# Patient Record
Sex: Male | Born: 1975
Health system: Southern US, Community
[De-identification: ages and names within clinical notes are randomized; demographics above are authoritative.]

---

## 2017-09-04 ENCOUNTER — Emergency Department (HOSPITAL_BASED_OUTPATIENT_CLINIC_OR_DEPARTMENT_OTHER)
Admission: EM | Admit: 2017-09-04 | Discharge: 2017-09-04 | Disposition: A | Discharge status: Home / Self Care | Payer: PRIVATE HEALTH INSURANCE | Attending: Emergency Medicine | Admitting: Emergency Medicine

## 2017-09-04 ENCOUNTER — Encounter (HOSPITAL_BASED_OUTPATIENT_CLINIC_OR_DEPARTMENT_OTHER): Payer: Self-pay | Admitting: Emergency Medicine

## 2017-09-04 ENCOUNTER — Other Ambulatory Visit: Payer: Self-pay

## 2017-09-04 DIAGNOSIS — K0889 Other specified disorders of teeth and supporting structures: Secondary | ICD-10-CM | POA: Insufficient documentation

## 2017-09-04 DIAGNOSIS — K029 Dental caries, unspecified: Secondary | ICD-10-CM | POA: Insufficient documentation

## 2017-09-04 DIAGNOSIS — F17220 Nicotine dependence, chewing tobacco, uncomplicated: Secondary | ICD-10-CM | POA: Insufficient documentation

## 2017-09-04 MED ORDER — HYDROCODONE-ACETAMINOPHEN 5-325 MG PO TABS: 1.0000 | ORAL_TABLET | ORAL | 0 refills | Status: AC | PRN

## 2017-09-04 MED ORDER — CLINDAMYCIN HCL 300 MG PO CAPS: 300.0000 mg | ORAL_CAPSULE | Freq: Three times a day (TID) | ORAL | 0 refills | Status: AC

## 2017-09-04 MED FILL — HYDROCODON-APAP 5-325: 5-325 | 2 days supply | Qty: 10 | Fill #0

## 2017-09-04 MED FILL — CLINDAMYCIN HCL 300 MG CAPS: 300 | 10 days supply | Qty: 30 | Fill #0

## 2017-09-04 NOTE — Discharge Instructions (Signed)
Please continue Naproxen (Aleve) 500mg  twice a day. Take with food Continue Orajel as needed Take antibiotic three times daily for the next 10 days Take pain medicine as needed for severe pain. Do not drink alcohol or drive while taking this medicine Please make an appointment with a dentist Return if worsening

## 2017-09-04 NOTE — ED Triage Notes (Signed)
Patient states that he has pain to his right mouth and jaw and dental pain

## 2017-09-04 NOTE — ED Provider Notes (Signed)
[facility] EMERGENCY DEPARTMENT Provider Note   CSN: [id number] Arrival date & time: 09/04/17  1617     History   Chief Complaint Chief Complaint  Patient presents with  . Dental Pain    HPI Jacob Brennan is a 42 y.o. male who presents with dental pain. No significant PMH. He states that he's needed to get his right lower molar pulled for a while. It's been hurting the last 3-4 days. He's been taking Naproxen and using oralgel without relief. The pain radiates from his right jaw to his right ear. He denies fever, trouble swallowing, SOB. He has dental insurance but doesn't have a dentist or an appointment coming up.  HPI  History reviewed. No pertinent past medical history.  There are no active problems to display for this patient.   History reviewed. No pertinent surgical history.     Home Medications    Prior to Admission medications   Medication Sig Start Date End Date Taking? Authorizing Provider  clindamycin (CLEOCIN) 300 MG capsule Take 1 capsule (300 mg total) by mouth 3 (three) times daily. 09/04/17   Bethel Born, PA-C  HYDROcodone-acetaminophen (NORCO/VICODIN) 5-325 MG tablet Take 1 tablet by mouth every 4 (four) hours as needed. 09/04/17   Bethel Born, PA-C    Family History History reviewed. No pertinent family history.  Social History Social History   Tobacco Use  . Smoking status: Current Every Day Smoker  . Smokeless tobacco: Current User  Substance Use Topics  . Alcohol use: Yes    Frequency: Never  . Drug use: No     Allergies   Patient has no known allergies.   Review of Systems Review of Systems  Constitutional: Negative for fever.  HENT: Positive for dental problem. Negative for facial swelling and trouble swallowing.   Respiratory: Negative for shortness of breath.      Physical Exam Updated Vital Signs BP (!) 153/92 (BP Location: Left Arm)   Pulse 72   Temp 97.8 F (36.6 C) (Oral)   Resp 16   Ht  6\' 1"  (1.854 m)   Wt 104.3 kg (230 lb)   SpO2 98%   BMI 30.34 kg/m   Physical Exam  Constitutional: He is oriented to person, place, and time. He appears well-developed and well-nourished. No distress.  HENT:  Head: Normocephalic and atraumatic.  Right Ear: Hearing, tympanic membrane, external ear and ear canal normal.  Left Ear: Hearing, tympanic membrane, external ear and ear canal normal.  Mouth/Throat: Uvula is midline and oropharynx is clear and moist. No trismus in the jaw. Abnormal dentition (multiple dental fillings). Dental caries present. No dental abscesses.    Eyes: Conjunctivae are normal. Pupils are equal, round, and reactive to light. Right eye exhibits no discharge. Left eye exhibits no discharge. No scleral icterus.  Neck: Normal range of motion.  Cardiovascular: Normal rate.  Pulmonary/Chest: Effort normal. No respiratory distress.  Abdominal: He exhibits no distension.  Neurological: He is alert and oriented to person, place, and time.  Skin: Skin is warm and dry.  Psychiatric: He has a normal mood and affect. His behavior is normal.  Nursing note and vitals reviewed.    ED Treatments / Results  Labs (all labs ordered are listed, but only abnormal results are displayed) Labs Reviewed - No data to display  EKG  EKG Interpretation None       Radiology No results found.  Procedures Procedures (including critical care time)  Medications Ordered in ED Medications -  No data to display   Initial Impression / Assessment and Plan / ED Course  I have reviewed the triage vital signs and the nursing notes.  Pertinent labs & imaging results that were available during my care of the patient were reviewed by me and considered in my medical decision making (see chart for details).  Dental pain associated with dental caries and possible dental infection. Patient is afebrile, non toxic appearing, and swallowing secretions well. I gave patient referral to dentist  and stressed the importance of dental follow up for ultimate management of dental pain. He was given rx for Clindamycin and Norco. He was advised to continue OTC meds as well. I have also discussed reasons to return immediately to the ER.  Patient expresses understanding and agrees with plan.   Final Clinical Impressions(s) / ED Diagnoses   Final diagnoses:  Pain, dental    ED Discharge Orders        Ordered    clindamycin (CLEOCIN) 300 MG capsule  3 times daily     09/04/17 1646    HYDROcodone-acetaminophen (NORCO/VICODIN) 5-325 MG tablet  Every 4 hours PRN     09/04/17 1646       Bethel BornGekas, Christon Gallaway Marie, PA-C 09/04/17 1651    Cardama, Amadeo GarnetPedro Eduardo, MD 09/05/17 0045

## 2017-10-14 ENCOUNTER — Encounter (HOSPITAL_BASED_OUTPATIENT_CLINIC_OR_DEPARTMENT_OTHER): Payer: Self-pay | Admitting: Emergency Medicine

## 2017-10-14 ENCOUNTER — Emergency Department (HOSPITAL_BASED_OUTPATIENT_CLINIC_OR_DEPARTMENT_OTHER): Payer: PRIVATE HEALTH INSURANCE

## 2017-10-14 ENCOUNTER — Other Ambulatory Visit: Payer: Self-pay

## 2017-10-14 ENCOUNTER — Emergency Department (HOSPITAL_BASED_OUTPATIENT_CLINIC_OR_DEPARTMENT_OTHER)
Admission: EM | Admit: 2017-10-14 | Discharge: 2017-10-15 | Disposition: A | Discharge status: Home / Self Care | Payer: PRIVATE HEALTH INSURANCE | Attending: Emergency Medicine | Admitting: Emergency Medicine

## 2017-10-14 DIAGNOSIS — F17200 Nicotine dependence, unspecified, uncomplicated: Secondary | ICD-10-CM | POA: Insufficient documentation

## 2017-10-14 DIAGNOSIS — Z79899 Other long term (current) drug therapy: Secondary | ICD-10-CM | POA: Diagnosis not present

## 2017-10-14 DIAGNOSIS — R197 Diarrhea, unspecified: Secondary | ICD-10-CM

## 2017-10-14 DIAGNOSIS — R112 Nausea with vomiting, unspecified: Secondary | ICD-10-CM

## 2017-10-14 DIAGNOSIS — K529 Noninfective gastroenteritis and colitis, unspecified: Secondary | ICD-10-CM | POA: Diagnosis not present

## 2017-10-14 LAB — CBC
HCT: 43.3 % (ref 39.0–52.0)
HEMOGLOBIN: 15.1 g/dL (ref 13.0–17.0)
MCH: 31.3 pg (ref 26.0–34.0)
MCHC: 34.9 g/dL (ref 30.0–36.0)
MCV: 89.8 fL (ref 78.0–100.0)
Platelets: 333 10*3/uL (ref 150–400)
RBC: 4.82 MIL/uL (ref 4.22–5.81)
RDW: 13.5 % (ref 11.5–15.5)
WBC: 14.5 10*3/uL — ABNORMAL HIGH (ref 4.0–10.5)

## 2017-10-14 LAB — COMPREHENSIVE METABOLIC PANEL
ALK PHOS: 137 U/L — AB (ref 38–126)
ALT: 43 U/L (ref 17–63)
ANION GAP: 12 (ref 5–15)
AST: 39 U/L (ref 15–41)
Albumin: 4.5 g/dL (ref 3.5–5.0)
BILIRUBIN TOTAL: 0.5 mg/dL (ref 0.3–1.2)
BUN: 15 mg/dL (ref 6–20)
CALCIUM: 9 mg/dL (ref 8.9–10.3)
CO2: 23 mmol/L (ref 22–32)
Chloride: 102 mmol/L (ref 101–111)
Creatinine, Ser: 1.01 mg/dL (ref 0.61–1.24)
GFR calc non Af Amer: >60 mL/min (ref >60)
Glucose, Bld: 125 mg/dL — ABNORMAL HIGH (ref 65–99)
Potassium: 3.7 mmol/L (ref 3.5–5.1)
Sodium: 137 mmol/L (ref 135–145)
TOTAL PROTEIN: 8.1 g/dL (ref 6.5–8.1)

## 2017-10-14 LAB — URINALYSIS, ROUTINE W REFLEX MICROSCOPIC
Bilirubin Urine: NEGATIVE
GLUCOSE, UA: NEGATIVE mg/dL
Hgb urine dipstick: NEGATIVE
KETONES UR: NEGATIVE mg/dL
Leukocytes, UA: NEGATIVE
NITRITE: NEGATIVE
PH: 6 (ref 5.0–8.0)
Protein, ur: NEGATIVE mg/dL
Specific Gravity, Urine: >1.03 — ABNORMAL HIGH (ref 1.005–1.030)

## 2017-10-14 LAB — LIPASE, BLOOD: Lipase: 22 U/L (ref 11–51)

## 2017-10-14 MED ORDER — SODIUM CHLORIDE 0.9 % IV BOLUS (SEPSIS)
1000.0000 mL | Freq: Once | INTRAVENOUS | Status: AC
  Administered 2017-10-14: 1000 mL via INTRAVENOUS

## 2017-10-14 MED ORDER — CIPROFLOXACIN HCL 500 MG PO TABS: 500.0000 mg | ORAL_TABLET | Freq: Two times a day (BID) | ORAL | 0 refills | Status: AC

## 2017-10-14 MED ORDER — METRONIDAZOLE 500 MG PO TABS: 500.0000 mg | ORAL_TABLET | Freq: Three times a day (TID) | ORAL | 0 refills | Status: AC

## 2017-10-14 MED ORDER — ONDANSETRON 4 MG PO TBDP
4.0000 mg | ORAL_TABLET | Freq: Once | ORAL | Status: AC | PRN
  Administered 2017-10-14: 4 mg via ORAL
  Filled 2017-10-14: qty 1

## 2017-10-14 MED ORDER — GI COCKTAIL ~~LOC~~
30.0000 mL | Freq: Once | ORAL | Status: AC
  Administered 2017-10-14: 30 mL via ORAL
  Filled 2017-10-14: qty 30

## 2017-10-14 MED ORDER — ACETAMINOPHEN 500 MG PO TABS
1000.0000 mg | ORAL_TABLET | Freq: Once | ORAL | Status: AC
  Administered 2017-10-14: 1000 mg via ORAL
  Filled 2017-10-14: qty 2

## 2017-10-14 MED ORDER — FAMOTIDINE IN NACL 20-0.9 MG/50ML-% IV SOLN
20.0000 mg | Freq: Once | INTRAVENOUS | Status: AC
  Administered 2017-10-14: 20 mg via INTRAVENOUS
  Filled 2017-10-14: qty 50

## 2017-10-14 MED ORDER — OMEPRAZOLE 20 MG PO CPDR: 20.0000 mg | DELAYED_RELEASE_CAPSULE | Freq: Every day | ORAL | 0 refills | Status: AC

## 2017-10-14 MED ORDER — ONDANSETRON 8 MG PO TBDP: 4.0000 mg | ORAL_TABLET | Freq: Three times a day (TID) | ORAL | 0 refills | Status: AC | PRN

## 2017-10-14 MED ORDER — IOPAMIDOL (ISOVUE-300) INJECTION 61%
100.0000 mL | Freq: Once | INTRAVENOUS | Status: AC | PRN
  Administered 2017-10-14: 100 mL via INTRAVENOUS

## 2017-10-14 NOTE — Discharge Instructions (Signed)
Your CT scan did show enteritis which is infection of your intestines.  Have given you antibiotics to take.  Do not drink alcohol this medication.  Have given you Zofran for nausea.  Drink plenty of fluids stay hydrated.  Clear liquid diet for 24 hours and advance her diet as tolerated.  Follow-up with your primary care doctor return to ED with any worsening symptoms.

## 2017-10-14 NOTE — ED Notes (Signed)
ED Provider at bedside. 

## 2017-10-14 NOTE — ED Triage Notes (Signed)
Vomiting and diarrhea since 4am.

## 2017-10-16 NOTE — ED Provider Notes (Signed)
[facility] EMERGENCY DEPARTMENT Provider Note   CSN: [id number] Arrival date & time: 10/14/17  1753     History   Chief Complaint Chief Complaint  Patient presents with  . Emesis  . Diarrhea    HPI Jacob Brennan is a 42 y.o. male.  HPI 42 year old African-American male with no pertinent past medical history presents the ED with complaints of nausea, vomiting, diarrhea with generalized abdominal pain.  States the symptoms started approximately 4 AM this morning.  He is not taking for symptoms prior to arrival.  Denies any chronic NSAID use, chronic alcohol use, recent travel.  Does report eating fried chicken last night.  Patient denies any known sick contacts.  Patient denies any bloody stools.  Denies any urinary symptoms, fevers.  Pain is cramping in nature and generalized in his abdomen.  Does not radiate.  Denies any associated hematemesis.  Patient has had several episodes of loose stool along with several episodes of nonbloody emesis.  Pt denies any fever, chill, ha, vision changes, lightheadedness, dizziness, congestion, neck pain, cp, sob, cough, urinary symptoms, melena, hematochezia, lower extremity paresthesias.  History reviewed. No pertinent past medical history.  There are no active problems to display for this patient.   History reviewed. No pertinent surgical history.     Home Medications    Prior to Admission medications   Medication Sig Start Date End Date Taking? Authorizing Provider  ciprofloxacin (CIPRO) 500 MG tablet Take 1 tablet (500 mg total) by mouth every 12 (twelve) hours. 10/14/17   Rise MuLeaphart, Kenneth T, PA-C  clindamycin (CLEOCIN) 300 MG capsule Take 1 capsule (300 mg total) by mouth 3 (three) times daily. 09/04/17   Bethel BornGekas, Kelly Marie, PA-C  HYDROcodone-acetaminophen (NORCO/VICODIN) 5-325 MG tablet Take 1 tablet by mouth every 4 (four) hours as needed. 09/04/17   Bethel BornGekas, Kelly Marie, PA-C  metroNIDAZOLE (FLAGYL) 500 MG tablet Take 1  tablet (500 mg total) by mouth 3 (three) times daily. DO NOT CONSUME ALCOHOL WHILE TAKING THIS MEDICATION. 10/14/17   Rise MuLeaphart, Kenneth T, PA-C  omeprazole (PRILOSEC) 20 MG capsule Take 1 capsule (20 mg total) by mouth daily. 10/14/17   Demetrios LollLeaphart, Kenneth T, PA-C  ondansetron (ZOFRAN-ODT) 8 MG disintegrating tablet Take 0.5 tablets (4 mg total) by mouth every 8 (eight) hours as needed for nausea. 10/14/17   Rise MuLeaphart, Kenneth T, PA-C    Family History No family history on file.  Social History Social History   Tobacco Use  . Smoking status: Current Every Day Smoker  . Smokeless tobacco: Current User  Substance Use Topics  . Alcohol use: Yes    Frequency: Never  . Drug use: No     Allergies   Patient has no known allergies.   Review of Systems Review of Systems  Constitutional: Negative for chills and fever.  HENT: Negative for congestion.   Respiratory: Negative for cough.   Gastrointestinal: Positive for abdominal pain, diarrhea, nausea and vomiting. Negative for abdominal distention, blood in stool and constipation.  Genitourinary: Negative for dysuria, frequency, hematuria and urgency.  Musculoskeletal: Negative for myalgias.  Skin: Negative for color change.  Neurological: Negative for dizziness and light-headedness.     Physical Exam Updated Vital Signs BP 137/83 (BP Location: Left Arm)   Pulse 68   Temp 99 F (37.2 C) (Oral)   Resp 18   Ht 6\' 1"  (1.854 m)   Wt 105.2 kg (232 lb)   SpO2 98%   BMI 30.61 kg/m   Physical Exam  Constitutional:  He is oriented to person, place, and time. He appears well-developed and well-nourished.  Non-toxic appearance. No distress.  HENT:  Head: Normocephalic and atraumatic.  Mouth/Throat: Oropharynx is clear and moist.  Eyes: Conjunctivae are normal. Pupils are equal, round, and reactive to light. Right eye exhibits no discharge. Left eye exhibits no discharge.  Neck: Normal range of motion. Neck supple.  Cardiovascular: Normal  rate, regular rhythm, normal heart sounds and intact distal pulses. Exam reveals no gallop and no friction rub.  No murmur heard. Pulmonary/Chest: Effort normal and breath sounds normal. No stridor. No respiratory distress. He has no wheezes. He has no rales. He exhibits no tenderness.  Abdominal: Soft. He exhibits no distension. Bowel sounds are increased. There is generalized tenderness. There is no rigidity, no rebound, no guarding, no CVA tenderness, no tenderness at McBurney's point and negative Murphy's sign.  Musculoskeletal: Normal range of motion. He exhibits no tenderness.  Lymphadenopathy:    He has no cervical adenopathy.  Neurological: He is alert and oriented to person, place, and time.  Skin: Skin is warm and dry. Capillary refill takes less than 2 seconds. No rash noted.  Psychiatric: His behavior is normal. Judgment and thought content normal.  Nursing note and vitals reviewed.    ED Treatments / Results  Labs (all labs ordered are listed, but only abnormal results are displayed) Labs Reviewed  URINALYSIS, ROUTINE W REFLEX MICROSCOPIC - Abnormal; Notable for the following components:      Result Value   Specific Gravity, Urine >1.030 (*)    All other components within normal limits  COMPREHENSIVE METABOLIC PANEL - Abnormal; Notable for the following components:   Glucose, Bld 125 (*)    Alkaline Phosphatase 137 (*)    All other components within normal limits  CBC - Abnormal; Notable for the following components:   WBC 14.5 (*)    All other components within normal limits  LIPASE, BLOOD    EKG  EKG Interpretation None       Radiology Ct Abdomen Pelvis W Contrast  Result Date: 10/14/2017 CLINICAL DATA:  Nausea, vomiting and diarrhea beginning at 0400 hours. EXAM: CT ABDOMEN AND PELVIS WITH CONTRAST TECHNIQUE: Multidetector CT imaging of the abdomen and pelvis was performed using the standard protocol following bolus administration of intravenous contrast.  CONTRAST:  ISOVUE-300 IOPAMIDOL (ISOVUE-300) INJECTION 61% COMPARISON:  None. FINDINGS: LOWER CHEST: Lung bases are clear. Included heart size is normal. No pericardial effusion. HEPATOBILIARY: Liver and gallbladder are normal. PANCREAS: Normal. SPLEEN: Normal. ADRENALS/URINARY TRACT: Kidneys are orthotopic, demonstrating symmetric enhancement. No nephrolithiasis, hydronephrosis or solid renal masses. The unopacified ureters are normal in course and caliber. Urinary bladder is partially distended and unremarkable. Normal adrenal glands. STOMACH/BOWEL: There small hiatal hernia. The stomach, small and large bowel are normal in caliber without inflammatory changes. Fluid-filled small bowel. Proximal small bowel predominately in RIGHT upper quadrant, ligament of Treitz not well characterized without enteric contrast. Normal appendix. VASCULAR/LYMPHATIC: Aortoiliac vessels are normal in course and caliber. No lymphadenopathy by CT size criteria. REPRODUCTIVE: Normal. OTHER: No intraperitoneal free fluid or free air. Tiny fat containing umbilical hernia. MUSCULOSKELETAL: Nonacute. IMPRESSION: 1. Fluid-filled small bowel associated with enteritis. Normal appendix. Electronically Signed   By: Awilda Metro M.D.   On: 10/14/2017 22:34    Procedures Procedures (including critical care time)  Medications Ordered in ED Medications  ondansetron (ZOFRAN-ODT) disintegrating tablet 4 mg (4 mg Oral Given 10/14/17 1812)  sodium chloride 0.9 % bolus 1,000 mL (0 mLs Intravenous  Stopped 10/14/17 2325)  famotidine (PEPCID) IVPB 20 mg premix (0 mg Intravenous Stopped 10/14/17 2207)  gi cocktail (Maalox,Lidocaine,Donnatal) (30 mLs Oral Given 10/14/17 2131)  iopamidol (ISOVUE-300) 61 % injection 100 mL (100 mLs Intravenous Contrast Given 10/14/17 2158)  acetaminophen (TYLENOL) tablet 1,000 mg (1,000 mg Oral Given 10/14/17 2215)     Initial Impression / Assessment and Plan / ED Course  I have reviewed the triage vital  signs and the nursing notes.  Pertinent labs & imaging results that were available during my care of the patient were reviewed by me and considered in my medical decision making (see chart for details).     Patient with symptoms consistent with enteritis.  Patient with leukocytosis of 14,000.  Normal lipase.  Liver enzymes unremarkable.  CT scan was obtained given patient's significant tenderness along with elevated white blood cell count.  Does note enteritis but no other focal findings.  Vitals are stable, no fever.  No signs of dehydration, tolerating PO fluids > 6 oz.  Lungs are clear.  Repeat abdominal exam is benign without any signs of peritonitis.  Patient will be started on Cipro and Flagyl for enteritis.  Supportive therapy indicated with return if symptoms worsen.    Pt is hemodynamically stable, in NAD, & able to ambulate in the ED. Evaluation does not show pathology that would require ongoing emergent intervention or inpatient treatment. I explained the diagnosis to the patient. Pain has been managed & has no complaints prior to dc. Pt is comfortable with above plan and is stable for discharge at this time. All questions were answered prior to disposition. Strict return precautions for f/u to the ED were discussed. Encouraged follow up with PCP.  Pt dicussed with my attending who is agreeable with the above plan.   Final Clinical Impressions(s) / ED Diagnoses   Final diagnoses:  Nausea vomiting and diarrhea  Enteritis    ED Discharge Orders        Ordered    metroNIDAZOLE (FLAGYL) 500 MG tablet  3 times daily     10/14/17 2311    ciprofloxacin (CIPRO) 500 MG tablet  Every 12 hours     10/14/17 2311    ondansetron (ZOFRAN-ODT) 8 MG disintegrating tablet  Every 8 hours PRN     10/14/17 2312    omeprazole (PRILOSEC) 20 MG capsule  Daily     10/14/17 2312       Rise Mu, PA-C 10/16/17 1615    Tegeler, Canary Brim, MD 10/16/17 2055

## 2018-04-02 ENCOUNTER — Other Ambulatory Visit: Payer: Self-pay

## 2018-04-02 ENCOUNTER — Emergency Department (HOSPITAL_BASED_OUTPATIENT_CLINIC_OR_DEPARTMENT_OTHER)
Admission: EM | Admit: 2018-04-02 | Discharge: 2018-04-02 | Disposition: A | Discharge status: Home / Self Care | Payer: PRIVATE HEALTH INSURANCE | Attending: Emergency Medicine | Admitting: Emergency Medicine

## 2018-04-02 ENCOUNTER — Encounter (HOSPITAL_BASED_OUTPATIENT_CLINIC_OR_DEPARTMENT_OTHER): Payer: Self-pay | Admitting: Emergency Medicine

## 2018-04-02 DIAGNOSIS — Y999 Unspecified external cause status: Secondary | ICD-10-CM | POA: Diagnosis not present

## 2018-04-02 DIAGNOSIS — F17200 Nicotine dependence, unspecified, uncomplicated: Secondary | ICD-10-CM | POA: Insufficient documentation

## 2018-04-02 DIAGNOSIS — Y93H2 Activity, gardening and landscaping: Secondary | ICD-10-CM | POA: Diagnosis not present

## 2018-04-02 DIAGNOSIS — Y92017 Garden or yard in single-family (private) house as the place of occurrence of the external cause: Secondary | ICD-10-CM | POA: Insufficient documentation

## 2018-04-02 DIAGNOSIS — Z23 Encounter for immunization: Secondary | ICD-10-CM | POA: Insufficient documentation

## 2018-04-02 DIAGNOSIS — Z79899 Other long term (current) drug therapy: Secondary | ICD-10-CM | POA: Insufficient documentation

## 2018-04-02 DIAGNOSIS — S81811A Laceration without foreign body, right lower leg, initial encounter: Secondary | ICD-10-CM | POA: Insufficient documentation

## 2018-04-02 DIAGNOSIS — W268XXA Contact with other sharp object(s), not elsewhere classified, initial encounter: Secondary | ICD-10-CM | POA: Diagnosis not present

## 2018-04-02 MED ORDER — IBUPROFEN 600 MG PO TABS: 600.0000 mg | ORAL_TABLET | Freq: Four times a day (QID) | ORAL | 0 refills | Status: AC | PRN

## 2018-04-02 MED ORDER — LIDOCAINE-EPINEPHRINE (PF) 2 %-1:200000 IJ SOLN
10.0000 mL | Freq: Once | INTRAMUSCULAR | Status: AC
  Administered 2018-04-02: 10 mL
  Filled 2018-04-02 (×2): qty 10

## 2018-04-02 MED ORDER — TETANUS-DIPHTH-ACELL PERTUSSIS 5-2.5-18.5 LF-MCG/0.5 IM SUSP
0.5000 mL | Freq: Once | INTRAMUSCULAR | Status: AC
  Administered 2018-04-02: 0.5 mL via INTRAMUSCULAR
  Filled 2018-04-02: qty 0.5

## 2018-04-02 NOTE — ED Notes (Signed)
Pt given wound care instructions. Bacitracin, and kerlex applied to stitches/lacs

## 2018-04-02 NOTE — Discharge Instructions (Signed)
Please read and follow all provided instructions.  Your diagnoses today is a laceration. A laceration is a cut or lesion that goes through all layers of the skin and into the tissue just beneath the skin. This was repaired with stitches or a tissue adhesive similar to a super glue.  Follow up with your doctor, an urgent care, or this Emergency Department for removal of your stitches in  7-10 days. Keep the wound clean and dry for the next 24 hours and leave the dressing in place. You may shower after 24 hours. Do not soak the area for long periods of times as in a bath until the sutures are removed. After 24 hours you may remove the dressing and gently clean the laceration site with antibacterial soap (i.e. Neosporin or Bacitracin) and warm water 2 times a day. Pat dry with clean towel. Do not scrub. Once the wound has healed, scarring can be minimized by covering the wound with sunscreen during the day for 1 full year.  Return instructions:  You have redness, swelling, or increasing pain in the wound.  You see a red line that goes away from the wound.  You have yellowish-white fluid (pus) coming from the wound.  You have a fever (above 100.33F) You notice a bad smell coming from the wound or dressing.  Your wound breaks open before or after sutures have been removed.  You notice something coming out of the wound such as wood or glass.  Your wound is on your hand or foot and you cannot move a finger or toe.  Your pain is not controlled with prescribed medicine.     Additional Information:  If you did not receive a tetanus shot today because you thought you were up to date, but did not recall when your last one was given, make sure to check with your primary caregiver to determine if you need one.   Your vital signs today were: BP (!) 143/82 (BP Location: Left Arm)    Pulse 74    Temp 98 F (36.7 C) (Oral)    Resp 20    Ht 6\' 1"  (1.854 m)    Wt 99.8 kg    SpO2 98%    BMI 29.03 kg/m  If your  blood pressure (BP) was elevated above 135/85 this visit, please have this repeated by your doctor within one month.

## 2018-04-02 NOTE — ED Provider Notes (Signed)
[facility] EMERGENCY DEPARTMENT Provider Note   CSN: [id number] Arrival date & time: 04/02/18  1648     History   Chief Complaint Chief Complaint  Patient presents with  . Laceration    HPI Jacob Brennan is a 42 y.o. male with no significant past medical history presents emergency department today for right lower leg laceration.  Reports that he was riding his lawnmower when his leg scraped against something that was sticking out from side of the house.  He notes 2 small lacerations to his right lower leg.  He reports he has wrapped the area himself.  Bleeding is currently controlled.  He denies any difficulty with range of motion of the ankle, or distal numbness/tingling/weakness.  He is unsure his last tetanus shot.  HPI  History reviewed. No pertinent past medical history.  There are no active problems to display for this patient.   History reviewed. No pertinent surgical history.      Home Medications    Prior to Admission medications   Medication Sig Start Date End Date Taking? Authorizing Provider  ciprofloxacin (CIPRO) 500 MG tablet Take 1 tablet (500 mg total) by mouth every 12 (twelve) hours. 10/14/17   Rise MuLeaphart, Kenneth T, PA-C  clindamycin (CLEOCIN) 300 MG capsule Take 1 capsule (300 mg total) by mouth 3 (three) times daily. 09/04/17   Bethel BornGekas, Kelly Marie, PA-C  HYDROcodone-acetaminophen (NORCO/VICODIN) 5-325 MG tablet Take 1 tablet by mouth every 4 (four) hours as needed. 09/04/17   Bethel BornGekas, Kelly Marie, PA-C  metroNIDAZOLE (FLAGYL) 500 MG tablet Take 1 tablet (500 mg total) by mouth 3 (three) times daily. DO NOT CONSUME ALCOHOL WHILE TAKING THIS MEDICATION. 10/14/17   Rise MuLeaphart, Kenneth T, PA-C  omeprazole (PRILOSEC) 20 MG capsule Take 1 capsule (20 mg total) by mouth daily. 10/14/17   Demetrios LollLeaphart, Kenneth T, PA-C  ondansetron (ZOFRAN-ODT) 8 MG disintegrating tablet Take 0.5 tablets (4 mg total) by mouth every 8 (eight) hours as needed for nausea. 10/14/17    Rise MuLeaphart, Kenneth T, PA-C    Family History History reviewed. No pertinent family history.  Social History Social History   Tobacco Use  . Smoking status: Current Every Day Smoker  . Smokeless tobacco: Current User  Substance Use Topics  . Alcohol use: Yes    Frequency: Never  . Drug use: No     Allergies   Patient has no known allergies.   Review of Systems Review of Systems  Constitutional: Negative for fever.  Musculoskeletal: Negative for arthralgias.  Skin: Positive for wound.  Neurological: Negative for weakness and numbness.  All other systems reviewed and are negative.    Physical Exam Updated Vital Signs BP (!) 143/82 (BP Location: Left Arm)   Pulse 74   Temp 98 F (36.7 C) (Oral)   Resp 20   Ht 6\' 1"  (1.854 m)   Wt 99.8 kg   SpO2 98%   BMI 29.03 kg/m   Physical Exam  Constitutional: He appears well-developed and well-nourished.  HENT:  Head: Normocephalic and atraumatic.  Right Ear: External ear normal.  Left Ear: External ear normal.  Eyes: Conjunctivae are normal. Right eye exhibits no discharge. Left eye exhibits no discharge. No scleral icterus.  Cardiovascular:  Pulses:      Dorsalis pedis pulses are 2+ on the right side.       Posterior tibial pulses are 2+ on the right side.  Pulmonary/Chest: Effort normal. No respiratory distress.  Musculoskeletal:       Right ankle:  Normal. Achilles tendon normal.       Legs:      Right foot: Normal.  Neurological: He is alert. He has normal strength. No sensory deficit.  Skin: Skin is warm and dry. Capillary refill takes less than 2 seconds. Laceration noted. No pallor.  Psychiatric: He has a normal mood and affect.  Nursing note and vitals reviewed.    ED Treatments / Results  Labs (all labs ordered are listed, but only abnormal results are displayed) Labs Reviewed - No data to display  EKG None  Radiology No results found.  Procedures .Marland Kitchen.Laceration Repair Date/Time: 04/02/2018 6:34  PM Performed by: Jacinto HalimMaczis, Nilda Keathley M, PA-C Authorized by: Jacinto HalimMaczis, Honor Frison M, PA-C   Consent:    Consent obtained:  Verbal   Consent given by:  Patient   Risks discussed:  Infection, nerve damage, need for additional repair, pain, poor cosmetic result, poor wound healing, vascular damage, tendon damage and retained foreign body   Alternatives discussed:  No treatment Anesthesia (see MAR for exact dosages):    Anesthesia method:  Local infiltration   Local anesthetic:  Lidocaine 2% WITH epi Laceration details:    Location:  Leg   Leg location:  R lower leg   Length (cm):  2 Repair type:    Repair type:  Simple Pre-procedure details:    Preparation:  Patient was prepped and draped in usual sterile fashion Exploration:    Hemostasis achieved with:  Direct pressure   Wound exploration: wound explored through full range of motion and entire depth of wound probed and visualized     Contaminated: no   Treatment:    Area cleansed with:  Shur-Clens and saline   Amount of cleaning:  Standard   Irrigation solution:  Sterile saline   Irrigation volume:  100   Irrigation method:  Syringe   Visualized foreign bodies/material removed: no   Skin repair:    Repair method:  Sutures   Suture size:  4-0   Wound skin closure material used: vicryl.   Suture technique:  Simple interrupted   Number of sutures:  6 Approximation:    Approximation:  Close Post-procedure details:    Dressing:  Open (no dressing)   Patient tolerance of procedure:  Tolerated well, no immediate complications  .Marland Kitchen.Laceration Repair Date/Time: 04/02/2018 6:44 PM Performed by: Jacinto HalimMaczis, Jorgen Wolfinger M, PA-C Authorized by: Jacinto HalimMaczis, Iesha Summerhill M, PA-C   Consent:    Consent obtained:  Verbal   Consent given by:  Patient   Risks discussed:  Infection, need for additional repair, nerve damage, poor wound healing, poor cosmetic result, pain, retained foreign body, tendon damage and vascular damage   Alternatives discussed:  No  treatment Anesthesia (see MAR for exact dosages):    Anesthesia method:  None Laceration details:    Location:  Leg   Leg location:  R lower leg   Length (cm):  1 Repair type:    Repair type:  Simple Pre-procedure details:    Preparation:  Patient was prepped and draped in usual sterile fashion Exploration:    Hemostasis achieved with:  Direct pressure   Wound exploration: wound explored through full range of motion and entire depth of wound probed and visualized     Contaminated: no   Treatment:    Area cleansed with:  Shur-Clens and saline   Amount of cleaning:  Standard   Irrigation solution:  Sterile saline   Irrigation volume:  100   Irrigation method:  Syringe   Visualized foreign bodies/material  removed: no   Skin repair:    Repair method:  Sutures   Suture size:  5-0   Wound skin closure material used: Vicryl.   Suture technique:  Simple interrupted   Number of sutures:  3 Approximation:    Approximation:  Close Post-procedure details:    Dressing:  Open (no dressing)   Patient tolerance of procedure:  Tolerated well, no immediate complications   (including critical care time)  Medications Ordered in ED Medications  lidocaine-EPINEPHrine (XYLOCAINE W/EPI) 2 %-1:200000 (PF) injection 10 mL (10 mLs Infiltration Given 04/02/18 1727)  Tdap (BOOSTRIX) injection 0.5 mL (0.5 mLs Intramuscular Given 04/02/18 1727)     Initial Impression / Assessment and Plan / ED Course  I have reviewed the triage vital signs and the nursing notes.  Pertinent labs & imaging results that were available during my care of the patient were reviewed by me and considered in my medical decision making (see chart for details).     42 y.o. male with lacerations to the right lower leg. He is NVI. No evidence of tendon injury. Pressure irrigation performed. Wound explored and base of wound visualized in a bloodless field without evidence of foreign body.  Laceration occurred < 8 hours prior to  repair which was well tolerated.  Tdap updated.  Pt has  no comorbidities to effect normal wound healing. Pt discharged  without antibiotics.  Discussed suture home care with patient and answered questions. Pt to follow-up for wound check and suture removal in 7 days; they are to return to the ED sooner for signs of infection. Pt is hemodynamically stable with no complaints prior to dc.   Final Clinical Impressions(s) / ED Diagnoses   Final diagnoses:  Laceration of right lower leg, initial encounter    ED Discharge Orders         Ordered    ibuprofen (ADVIL,MOTRIN) 600 MG tablet  Every 6 hours PRN     04/02/18 1846           Princella Pellegrini 04/02/18 2018    Melene Plan, DO 04/02/18 2241

## 2018-04-02 NOTE — ED Notes (Signed)
Wound unwrapped from the patients wrapping and rewrapped -wound is still oozing blood

## 2018-04-02 NOTE — ED Triage Notes (Signed)
Patient states that he was on the riding lawnmower and cut his right lower leg on a piece of metal.

## 2018-10-03 ENCOUNTER — Emergency Department (HOSPITAL_BASED_OUTPATIENT_CLINIC_OR_DEPARTMENT_OTHER)
Admission: EM | Admit: 2018-10-03 | Discharge: 2018-10-03 | Disposition: A | Discharge status: Home / Self Care | Payer: PRIVATE HEALTH INSURANCE | Attending: Emergency Medicine | Admitting: Emergency Medicine

## 2018-10-03 ENCOUNTER — Encounter (HOSPITAL_BASED_OUTPATIENT_CLINIC_OR_DEPARTMENT_OTHER): Payer: Self-pay | Admitting: Emergency Medicine

## 2018-10-03 ENCOUNTER — Emergency Department (HOSPITAL_BASED_OUTPATIENT_CLINIC_OR_DEPARTMENT_OTHER): Payer: PRIVATE HEALTH INSURANCE

## 2018-10-03 ENCOUNTER — Other Ambulatory Visit: Payer: Self-pay

## 2018-10-03 DIAGNOSIS — H6693 Otitis media, unspecified, bilateral: Secondary | ICD-10-CM | POA: Diagnosis not present

## 2018-10-03 DIAGNOSIS — F17200 Nicotine dependence, unspecified, uncomplicated: Secondary | ICD-10-CM | POA: Insufficient documentation

## 2018-10-03 DIAGNOSIS — Z79899 Other long term (current) drug therapy: Secondary | ICD-10-CM | POA: Insufficient documentation

## 2018-10-03 DIAGNOSIS — R69 Illness, unspecified: Secondary | ICD-10-CM

## 2018-10-03 DIAGNOSIS — M791 Myalgia, unspecified site: Secondary | ICD-10-CM | POA: Diagnosis present

## 2018-10-03 DIAGNOSIS — J111 Influenza due to unidentified influenza virus with other respiratory manifestations: Secondary | ICD-10-CM | POA: Insufficient documentation

## 2018-10-03 MED ORDER — ACETAMINOPHEN 325 MG PO TABS
650.0000 mg | ORAL_TABLET | Freq: Once | ORAL | Status: AC
  Administered 2018-10-03: 650 mg via ORAL

## 2018-10-03 MED ORDER — AMOXICILLIN 500 MG PO CAPS
500.0000 mg | ORAL_CAPSULE | Freq: Three times a day (TID) | ORAL | 0 refills | Status: AC
Start: 2018-10-03 — End: ?

## 2018-10-03 MED ORDER — ACETAMINOPHEN 325 MG PO TABS
ORAL_TABLET | ORAL | Status: AC
  Filled 2018-10-03: qty 2

## 2018-10-03 MED ORDER — OSELTAMIVIR PHOSPHATE 75 MG PO CAPS: 75.0000 mg | ORAL_CAPSULE | Freq: Two times a day (BID) | ORAL | 0 refills | Status: AC

## 2018-10-03 MED FILL — OSELTAMIVIR PHOSPHATE 75 MG: 75 | 5 days supply | Qty: 10 | Fill #0

## 2018-10-03 MED FILL — AMOXICILLIN 500 MG CAPSULE: 500 | 7 days supply | Qty: 21 | Fill #0

## 2018-10-03 NOTE — Discharge Instructions (Addendum)
You were seen in the ER for flu like symptoms Prescribed medicines:  - Tamiflu- treatment for flu - Amoxicillin- antibiotics for ear infection- wait 48 hours to see if symptoms improve prior to starting this medicine.   We have prescribed you new medication(s) today. Discuss the medications prescribed today with your pharmacist as they can have adverse effects and interactions with your other medicines including over the counter and prescribed medications. Seek medical evaluation if you start to experience new or abnormal symptoms after taking one of these medicines, seek care immediately if you start to experience difficulty breathing, feeling of your throat closing, facial swelling, or rash as these could be indications of a more serious allergic reaction  Follow up with primary care within 1 week. Return to the ER for new or worsening symptoms or any other concerns.

## 2018-10-03 NOTE — ED Provider Notes (Signed)
[facility] EMERGENCY DEPARTMENT Provider Note   CSN: [id number] Arrival date & time: 10/03/18  1553   History   Chief Complaint Chief Complaint  Patient presents with  . Generalized Body Aches    HPI Jacob Brennan is a 43 y.o. male with a hx of tobacco abuse who presents to the ED with complaints of flu like sxs x 2 days. Patient reports congestion, bilateral ear pain, sore throat, productive cough with yellow phlegm sputum, chills, subjective fevers, & generalized body aches. He states that with coughing he feels a bit short of breath at times, otherwise none. No alleviating/aggravating factors. Denies sick contacts w/ similar. Did not receive flu shot this year.   HPI  History reviewed. No pertinent past medical history.  There are no active problems to display for this patient.   History reviewed. No pertinent surgical history.      Home Medications    Prior to Admission medications   Medication Sig Start Date End Date Taking? Authorizing Provider  ciprofloxacin (CIPRO) 500 MG tablet Take 1 tablet (500 mg total) by mouth every 12 (twelve) hours. 10/14/17   Rise Mu, PA-C  clindamycin (CLEOCIN) 300 MG capsule Take 1 capsule (300 mg total) by mouth 3 (three) times daily. 09/04/17   Bethel Born, PA-C  HYDROcodone-acetaminophen (NORCO/VICODIN) 5-325 MG tablet Take 1 tablet by mouth every 4 (four) hours as needed. 09/04/17   Bethel Born, PA-C  ibuprofen (ADVIL,MOTRIN) 600 MG tablet Take 1 tablet (600 mg total) by mouth every 6 (six) hours as needed. 04/02/18   Maczis, Elmer Sow, PA-C  metroNIDAZOLE (FLAGYL) 500 MG tablet Take 1 tablet (500 mg total) by mouth 3 (three) times daily. DO NOT CONSUME ALCOHOL WHILE TAKING THIS MEDICATION. 10/14/17   Rise Mu, PA-C  omeprazole (PRILOSEC) 20 MG capsule Take 1 capsule (20 mg total) by mouth daily. 10/14/17   Demetrios Loll T, PA-C  ondansetron (ZOFRAN-ODT) 8 MG disintegrating tablet Take 0.5  tablets (4 mg total) by mouth every 8 (eight) hours as needed for nausea. 10/14/17   Rise Mu, PA-C    Family History History reviewed. No pertinent family history.  Social History Social History   Tobacco Use  . Smoking status: Current Every Day Smoker  . Smokeless tobacco: Current User  Substance Use Topics  . Alcohol use: Yes    Frequency: Never  . Drug use: Yes    Types: Marijuana     Allergies   Patient has no known allergies.   Review of Systems Review of Systems  Constitutional: Positive for chills and fever (subjective).  HENT: Positive for congestion, ear pain and sore throat.   Respiratory: Positive for cough and shortness of breath (w/ coughing). Negative for wheezing.   Cardiovascular: Negative for chest pain.  Gastrointestinal: Negative for abdominal pain and vomiting.  Musculoskeletal: Positive for myalgias (generalized).  Neurological: Negative for syncope.     Physical Exam Updated Vital Signs BP 103/65 (BP Location: Left Arm)   Pulse 97   Temp 97.9 F (36.6 C) (Oral)   Resp 16   Ht 6\' 1"  (1.854 m)   Wt 102.1 kg   SpO2 97%   BMI 29.69 kg/m   Physical Exam Vitals signs and nursing note reviewed.  Constitutional:      General: He is not in acute distress.    Appearance: He is well-developed. He is not toxic-appearing.  HENT:     Head: Normocephalic and atraumatic.     Right  Ear: Ear canal and external ear normal. Tympanic membrane is erythematous. Tympanic membrane is not perforated.     Left Ear: Ear canal and external ear normal. Tympanic membrane is erythematous. Tympanic membrane is not perforated.     Ears:     Comments: Purulence behind bilateral TMs. No complete loss of landmarks.  No mastoid erythema/warmth/swelling/tenderness.     Nose: Congestion present.     Right Sinus: No maxillary sinus tenderness or frontal sinus tenderness.     Left Sinus: No maxillary sinus tenderness or frontal sinus tenderness.      Mouth/Throat:     Pharynx: Uvula midline. Posterior oropharyngeal erythema present. No oropharyngeal exudate.     Comments: Posterior oropharynx is symmetric appearing. Patient tolerating own secretions without difficulty. No trismus. No drooling. No hot potato voice. No swelling beneath the tongue, submandibular compartment is soft.  Eyes:     General:        Right eye: No discharge.        Left eye: No discharge.     Extraocular Movements: Extraocular movements intact.     Conjunctiva/sclera: Conjunctivae normal.  Neck:     Musculoskeletal: Neck supple. No edema, neck rigidity or crepitus.  Cardiovascular:     Rate and Rhythm: Normal rate and regular rhythm.  Pulmonary:     Effort: Pulmonary effort is normal. No respiratory distress.     Breath sounds: Normal breath sounds. No wheezing, rhonchi or rales.  Abdominal:     General: There is no distension.     Palpations: Abdomen is soft.     Tenderness: There is no abdominal tenderness.  Skin:    General: Skin is warm and dry.     Findings: No rash.  Neurological:     Mental Status: He is alert.     Comments: Clear speech.   Psychiatric:        Behavior: Behavior normal.      ED Treatments / Results  Labs (all labs ordered are listed, but only abnormal results are displayed) Labs Reviewed - No data to display  EKG None  Radiology Dg Chest 2 View  Result Date: 10/03/2018 CLINICAL DATA:  Cough EXAM: CHEST - 2 VIEW COMPARISON:  None. FINDINGS: Normal heart size. Normal mediastinal contour. No pneumothorax. No pleural effusion. Lungs appear clear, with no acute consolidative airspace disease and no pulmonary edema. IMPRESSION: No active cardiopulmonary disease. Electronically Signed   By: Delbert Phenix M.D.   On: 10/03/2018 17:13    Procedures Procedures (including critical care time)  Medications Ordered in ED Medications - No data to display   Initial Impression / Assessment and Plan / ED Course  I have reviewed the  triage vital signs and the nursing notes.  Pertinent labs & imaging results that were available during my care of the patient were reviewed by me and considered in my medical decision making (see chart for details).   Patient presents to the ED with flu like sxs x 2 days. Nontoxic appearing, in no apparent distress, vitals WNL. Lungs CTA, CXR negative- doubt pneumonia. No sinus tenderness, < 7 days since onset of sxs- doubt acute bacterial sinusitis. Centor 0- doubt strep. No meningismus. Exam somewhat concerning for bilateral early AOM w/ erythema & purulence of bilateral TMs. No evidence of AOE or mastoiditis. Will provide abx given appearance w/ bilateral sxs- watch & wait for 48 hours. Also possible influenza, no comorbidities or age requirement to necessitate tamiflu- offered this medicine & discussed risks/benefits- patient  would like to receive this medicine. I discussed results, treatment plan, need for follow-up, and return precautions with the patient. Provided opportunity for questions, patient confirmed understanding and is in agreement with plan.    Final Clinical Impressions(s) / ED Diagnoses   Final diagnoses:  Influenza-like illness  Bilateral otitis media, unspecified otitis media type    ED Discharge Orders         Ordered    amoxicillin (AMOXIL) 500 MG capsule  3 times daily     10/03/18 1730    oseltamivir (TAMIFLU) 75 MG capsule  Every 12 hours     10/03/18 [id number]           Rishaan Gunner, HumansvilleSamantha R, PA-C 10/03/18 1734    Maia PlanLong, Joshua G, MD 10/04/18 1043

## 2018-10-03 NOTE — ED Triage Notes (Signed)
Reports generalized body aches, fever, chills, congestion which began yesterday.

## 2018-10-22 ENCOUNTER — Encounter (HOSPITAL_BASED_OUTPATIENT_CLINIC_OR_DEPARTMENT_OTHER): Payer: Self-pay | Admitting: Emergency Medicine

## 2018-10-22 ENCOUNTER — Emergency Department (HOSPITAL_BASED_OUTPATIENT_CLINIC_OR_DEPARTMENT_OTHER): Payer: PRIVATE HEALTH INSURANCE

## 2018-10-22 ENCOUNTER — Other Ambulatory Visit: Payer: Self-pay

## 2018-10-22 ENCOUNTER — Emergency Department (HOSPITAL_BASED_OUTPATIENT_CLINIC_OR_DEPARTMENT_OTHER)
Admission: EM | Admit: 2018-10-22 | Discharge: 2018-10-22 | Disposition: A | Discharge status: Home / Self Care | Payer: PRIVATE HEALTH INSURANCE | Attending: Emergency Medicine | Admitting: Emergency Medicine

## 2018-10-22 DIAGNOSIS — R112 Nausea with vomiting, unspecified: Secondary | ICD-10-CM | POA: Insufficient documentation

## 2018-10-22 DIAGNOSIS — B349 Viral infection, unspecified: Secondary | ICD-10-CM | POA: Diagnosis not present

## 2018-10-22 DIAGNOSIS — R1010 Upper abdominal pain, unspecified: Secondary | ICD-10-CM | POA: Diagnosis present

## 2018-10-22 DIAGNOSIS — R1011 Right upper quadrant pain: Secondary | ICD-10-CM | POA: Diagnosis not present

## 2018-10-22 DIAGNOSIS — Z79899 Other long term (current) drug therapy: Secondary | ICD-10-CM | POA: Diagnosis not present

## 2018-10-22 DIAGNOSIS — F17210 Nicotine dependence, cigarettes, uncomplicated: Secondary | ICD-10-CM | POA: Diagnosis not present

## 2018-10-22 DIAGNOSIS — R509 Fever, unspecified: Secondary | ICD-10-CM

## 2018-10-22 LAB — CBC WITH DIFFERENTIAL/PLATELET
ABS IMMATURE GRANULOCYTES: 0.05 10*3/uL (ref 0.00–0.07)
BASOS ABS: 0 10*3/uL (ref 0.0–0.1)
BASOS PCT: 0 %
EOS ABS: 0.1 10*3/uL (ref 0.0–0.5)
Eosinophils Relative: 0 %
HCT: 40.2 % (ref 39.0–52.0)
Hemoglobin: 13.3 g/dL (ref 13.0–17.0)
IMMATURE GRANULOCYTES: 0 %
Lymphocytes Relative: 8 %
Lymphs Abs: 1.1 10*3/uL (ref 0.7–4.0)
MCH: 30.9 pg (ref 26.0–34.0)
MCHC: 33.1 g/dL (ref 30.0–36.0)
MCV: 93.3 fL (ref 80.0–100.0)
MONOS PCT: 6 %
Monocytes Absolute: 0.8 10*3/uL (ref 0.1–1.0)
NEUTROS PCT: 86 %
NRBC: 0 % (ref 0.0–0.2)
Neutro Abs: 11.8 10*3/uL — ABNORMAL HIGH (ref 1.7–7.7)
PLATELETS: 411 10*3/uL — AB (ref 150–400)
RBC: 4.31 MIL/uL (ref 4.22–5.81)
RDW: 13.1 % (ref 11.5–15.5)
WBC: 13.8 10*3/uL — ABNORMAL HIGH (ref 4.0–10.5)

## 2018-10-22 LAB — COMPREHENSIVE METABOLIC PANEL
ALK PHOS: 114 U/L (ref 38–126)
ALT: 32 U/L (ref 0–44)
AST: 25 U/L (ref 15–41)
Albumin: 4 g/dL (ref 3.5–5.0)
Anion gap: 5 (ref 5–15)
BUN: 12 mg/dL (ref 6–20)
CO2: 27 mmol/L (ref 22–32)
Calcium: 8.4 mg/dL — ABNORMAL LOW (ref 8.9–10.3)
Chloride: 108 mmol/L (ref 98–111)
Creatinine, Ser: 0.95 mg/dL (ref 0.61–1.24)
GFR calc Af Amer: >60 mL/min (ref >60)
GFR calc non Af Amer: >60 mL/min (ref >60)
GLUCOSE: 113 mg/dL — AB (ref 70–99)
Potassium: 3.8 mmol/L (ref 3.5–5.1)
Sodium: 140 mmol/L (ref 135–145)
Total Bilirubin: 0.7 mg/dL (ref 0.3–1.2)
Total Protein: 6.9 g/dL (ref 6.5–8.1)

## 2018-10-22 LAB — URINALYSIS, ROUTINE W REFLEX MICROSCOPIC
Bilirubin Urine: NEGATIVE
Glucose, UA: NEGATIVE mg/dL
Hgb urine dipstick: NEGATIVE
Ketones, ur: NEGATIVE mg/dL
Leukocytes,Ua: NEGATIVE
Nitrite: NEGATIVE
PH: 7 (ref 5.0–8.0)
Protein, ur: NEGATIVE mg/dL
Specific Gravity, Urine: 1.01 (ref 1.005–1.030)

## 2018-10-22 LAB — LIPASE, BLOOD: Lipase: 27 U/L (ref 11–51)

## 2018-10-22 MED ORDER — SODIUM CHLORIDE 0.9 % IV BOLUS
1000.0000 mL | Freq: Once | INTRAVENOUS | Status: AC
  Administered 2018-10-22: 1000 mL via INTRAVENOUS

## 2018-10-22 MED ORDER — ONDANSETRON HCL 4 MG/2ML IJ SOLN
4.0000 mg | Freq: Once | INTRAMUSCULAR | Status: AC
  Administered 2018-10-22: 4 mg via INTRAVENOUS
  Filled 2018-10-22: qty 2

## 2018-10-22 MED ORDER — ACETAMINOPHEN 325 MG PO TABS
650.0000 mg | ORAL_TABLET | Freq: Once | ORAL | Status: AC
  Administered 2018-10-22: 650 mg via ORAL
  Filled 2018-10-22: qty 2

## 2018-10-22 MED ORDER — ONDANSETRON HCL 4 MG PO TABS: 4.0000 mg | ORAL_TABLET | Freq: Three times a day (TID) | ORAL | 0 refills | Status: AC | PRN

## 2018-10-22 MED FILL — ONDANSETRON HCL 4 MG TABLET: 4 | 4 days supply | Qty: 12 | Fill #0

## 2018-10-22 NOTE — ED Notes (Signed)
Patient called out for water.  States he is dehydrated.  Explained to patient that vomiting needed to be controlled prior to giving anything by mouth.  Explained to patient that vitals were stable and provider would be in to see him shortly.

## 2018-10-22 NOTE — ED Notes (Signed)
Patient is resting comfortably. 

## 2018-10-22 NOTE — ED Triage Notes (Addendum)
C/o vomiting which began this morning.  Denies diarrhea, fevers.  Ambulatory to room in NAD.

## 2018-10-22 NOTE — ED Notes (Signed)
PO ORDERED BY DOCTOR PT. DOING WELL

## 2018-10-22 NOTE — Discharge Instructions (Signed)
Your work-up today was overall reassuring.  I suspect you have a viral infection causing your symptoms.  Your labs were reassuring.  Please use the nausea medicine to help with your symptoms and maintain hydration.  Please rest.  Please to out of work today and tomorrow.  If any symptoms change or worsen, please return to the nearest emergency department.  Please follow-up with your PCP.

## 2018-10-22 NOTE — ED Provider Notes (Signed)
[facility] EMERGENCY DEPARTMENT Provider Note   CSN: [id number] Arrival date & time: 10/22/18  9562    History   Chief Complaint Chief Complaint  Patient presents with  . Emesis    HPI Jacob Brennan is a 43 y.o. male.     The history is provided by the patient and medical records. No language interpreter was used.  Emesis  Severity:  Severe Duration:  1 day Timing:  Intermittent Quality:  Stomach contents Progression:  Unchanged Chronicity:  New Recent urination:  Normal Relieved by:  Nothing Ineffective treatments:  None tried Associated symptoms: chills and myalgias   Associated symptoms: no abdominal pain, no cough, no diarrhea, no fever, no headaches, no sore throat and no URI (recent)     History reviewed. No pertinent past medical history.  There are no active problems to display for this patient.   History reviewed. No pertinent surgical history.      Home Medications    Prior to Admission medications   Medication Sig Start Date End Date Taking? Authorizing Provider  amoxicillin (AMOXIL) 500 MG capsule Take 1 capsule (500 mg total) by mouth 3 (three) times daily. 10/03/18   Petrucelli, Samantha R, PA-C  ciprofloxacin (CIPRO) 500 MG tablet Take 1 tablet (500 mg total) by mouth every 12 (twelve) hours. 10/14/17   Rise Mu, PA-C  clindamycin (CLEOCIN) 300 MG capsule Take 1 capsule (300 mg total) by mouth 3 (three) times daily. 09/04/17   Bethel Born, PA-C  HYDROcodone-acetaminophen (NORCO/VICODIN) 5-325 MG tablet Take 1 tablet by mouth every 4 (four) hours as needed. 09/04/17   Bethel Born, PA-C  ibuprofen (ADVIL,MOTRIN) 600 MG tablet Take 1 tablet (600 mg total) by mouth every 6 (six) hours as needed. 04/02/18   Maczis, Elmer Sow, PA-C  metroNIDAZOLE (FLAGYL) 500 MG tablet Take 1 tablet (500 mg total) by mouth 3 (three) times daily. DO NOT CONSUME ALCOHOL WHILE TAKING THIS MEDICATION. 10/14/17   Rise Mu, PA-C    omeprazole (PRILOSEC) 20 MG capsule Take 1 capsule (20 mg total) by mouth daily. 10/14/17   Demetrios Loll T, PA-C  ondansetron (ZOFRAN-ODT) 8 MG disintegrating tablet Take 0.5 tablets (4 mg total) by mouth every 8 (eight) hours as needed for nausea. 10/14/17   Rise Mu, PA-C  oseltamivir (TAMIFLU) 75 MG capsule Take 1 capsule (75 mg total) by mouth every 12 (twelve) hours. 10/03/18   Petrucelli, Pleas Koch, PA-C    Family History History reviewed. No pertinent family history.  Social History Social History   Tobacco Use  . Smoking status: Current Every Day Smoker  . Smokeless tobacco: Current User  Substance Use Topics  . Alcohol use: Yes    Frequency: Never  . Drug use: Yes    Types: Marijuana     Allergies   Patient has no known allergies.   Review of Systems Review of Systems  Constitutional: Positive for chills and fatigue. Negative for diaphoresis and fever.  HENT: Negative for congestion and sore throat.   Eyes: Negative for visual disturbance.  Respiratory: Negative for cough, choking, chest tightness, shortness of breath, wheezing and stridor.   Cardiovascular: Negative for chest pain and leg swelling.  Gastrointestinal: Positive for nausea and vomiting. Negative for abdominal pain, constipation and diarrhea.  Genitourinary: Negative for dysuria.  Musculoskeletal: Positive for myalgias. Negative for back pain, neck pain and neck stiffness.  Skin: Negative for rash and wound.  Neurological: Negative for light-headedness, numbness and headaches.  Psychiatric/Behavioral:  Negative for agitation.  All other systems reviewed and are negative.    Physical Exam Updated Vital Signs BP 133/85 (BP Location: Right Arm)   Pulse 97   Temp 98.2 F (36.8 C) (Oral)   Resp 18   Ht 6\' 1"  (1.854 m)   Wt 97.5 kg   SpO2 99%   BMI 28.37 kg/m   Physical Exam Vitals signs and nursing note reviewed.  Constitutional:      General: He is not in acute distress.     Appearance: He is well-developed. He is not ill-appearing, toxic-appearing or diaphoretic.  HENT:     Head: Normocephalic and atraumatic.     Right Ear: Tympanic membrane normal.     Left Ear: Tympanic membrane normal.     Nose: Nose normal. No congestion or rhinorrhea.     Mouth/Throat:     Mouth: Mucous membranes are moist.     Pharynx: No oropharyngeal exudate or posterior oropharyngeal erythema.  Eyes:     Conjunctiva/sclera: Conjunctivae normal.     Pupils: Pupils are equal, round, and reactive to light.  Neck:     Musculoskeletal: Neck supple. No muscular tenderness.  Cardiovascular:     Rate and Rhythm: Normal rate and regular rhythm.     Heart sounds: No murmur.  Pulmonary:     Effort: Pulmonary effort is normal. No respiratory distress.     Breath sounds: Normal breath sounds.  Abdominal:     Palpations: Abdomen is soft.     Tenderness: There is no abdominal tenderness. There is no right CVA tenderness, left CVA tenderness, guarding or rebound.  Musculoskeletal:        General: No tenderness.     Right lower leg: No edema.     Left lower leg: No edema.  Skin:    General: Skin is warm and dry.     Capillary Refill: Capillary refill takes less than 2 seconds.  Neurological:     General: No focal deficit present.     Mental Status: He is alert.      ED Treatments / Results  Labs (all labs ordered are listed, but only abnormal results are displayed) Labs Reviewed  CBC WITH DIFFERENTIAL/PLATELET - Abnormal; Notable for the following components:      Result Value   WBC 13.8 (*)    Platelets 411 (*)    Neutro Abs 11.8 (*)    All other components within normal limits  COMPREHENSIVE METABOLIC PANEL - Abnormal; Notable for the following components:   Glucose, Bld 113 (*)    Calcium 8.4 (*)    All other components within normal limits  URINE CULTURE  LIPASE, BLOOD  URINALYSIS, ROUTINE W REFLEX MICROSCOPIC    EKG None  Radiology Dg Chest 2 View  Result Date:  10/22/2018 CLINICAL DATA:  43 year old male with vomiting since this morning. Diagnosed with influenza last week. Upper abdominal pain and cough. EXAM: CHEST - 2 VIEW COMPARISON:  10/03/2018 chest radiographs. FINDINGS: Lower lung volumes. Mediastinal contours remain normal. Visualized tracheal air column is within normal limits. No pneumothorax, pulmonary edema, pleural effusion or confluent pulmonary opacity. No acute osseous abnormality identified. Negative visible bowel gas pattern. IMPRESSION: Lower lung volumes, otherwise no acute cardiopulmonary abnormality. Electronically Signed   By: Odessa Fleming M.D.   On: 10/22/2018 13:57   US Abdomen Limited Ruq  Result Date: 10/22/2018 CLINICAL DATA:  43 year old male with upper abdominal pain and vomiting since this morning. Diagnosed with influenza last week. EXAM: ULTRASOUND  ABDOMEN LIMITED RIGHT UPPER QUADRANT COMPARISON:  CT Abdomen and Pelvis 10/14/2017 FINDINGS: Gallbladder: No gallstones or sludge identified. Probable tiny 2-3 millimeter gallbladder polyp on image 11 (inconsequential). No gallbladder wall thickening. No sonographic Murphy sign noted by sonographer. Common bile duct: Diameter: 4-5 millimeters, normal. Liver: No focal lesion identified. Within normal limits in parenchymal echogenicity. Portal vein is patent on color Doppler imaging with normal direction of blood flow towards the liver. Other findings: Right kidney within normal limits; somewhat isoechoic to the liver. IMPRESSION: Negative. A tiny 2-3 mm gallbladder polyp appears inconsequential. Electronically Signed   By: Odessa Fleming M.D.   On: 10/22/2018 13:59    Procedures Procedures (including critical care time)  Medications Ordered in ED Medications  sodium chloride 0.9 % bolus 1,000 mL (0 mLs Intravenous Stopped 10/22/18 1232)  ondansetron (ZOFRAN) injection 4 mg (4 mg Intravenous Given 10/22/18 1007)  sodium chloride 0.9 % bolus 1,000 mL ( Intravenous Stopped 10/22/18 1524)  acetaminophen  (TYLENOL) tablet 650 mg (650 mg Oral Given 10/22/18 1526)     Initial Impression / Assessment and Plan / ED Course  I have reviewed the triage vital signs and the nursing notes.  Pertinent labs & imaging results that were available during my care of the patient were reviewed by me and considered in my medical decision making (see chart for details).        Laton Glasper is a 43 y.o. male with no significant past medical history who presents with fevers, chills, nausea, vomiting, and malaise.  Patient reports that last week he had influenza and had more URI symptoms such as congestion cough fevers, chills.  He reports that he was doing better until today when he said chills again as well as nausea and vomiting.  He denies significant abdominal pain constipation or diarrhea.  He reports that very dehydrated.  Denies recent trauma.  Denies other medication changes.  Denies any headache, neck pain or neck stiffness.  Does report malaise.  On exam, abdomen is nontender.  Lungs are clear and chest is nontender.  Patient has no focal neurologic deficits and has normal sensation and strength in extremities.  Normal pulses in extremities.  Vital signs reassuring on arrival.  Clinical aspect patient has a viral illness however due to his feeling very dehydrated and dry mucous membranes on exam, he will be given fluids.  He has had numerous episodes of vomiting here in the emergency department.  Will give IV Zofran.  Based on his reassuring vital signs, will hold on labs at this time and with his lack of cough and reassuring lung exam while on chest x-ray.  If patient is able to eat and drink after Zofran and rehydration, suspect he is stable for discharge home with work note.  Suspect viral infection.  Anticipate reassessment.  1:13 PM Patient was reassessed and reports other than nausea improving he is feeling much worse overall.  He reports his abdominal pain is worsened in the upper abdomen.  He  reports he is having pain "across my diaphragm" and some coughing.   Due to his worsening symptoms, he will be placed on telemetry, given more fluids, will have some labs as well as a right upper quadrant ultrasound and chest x-ray and urinalysis.  Continue suspect viral infection however given worsening symptoms, will make sure there are no other abnormalities causing his symptoms.  Patient is feeling much better after further fluids.  Labs are reassuring.  Mild leukocytosis likely indicative of his viral  infection.  Chest x-ray shows no pneumonia.  Her upper quadrant ultrasound showed no cholecystitis.  Other labs reassuring.  Patient likely viral infection causing nausea vomiting and abdominal symptoms.    Patient will follow with PCP and given prescription for nausea medication and work note.  Patient agreed with plan of care and instructions to maintain hydration.  He knows the questions or concerns and patient was discharged in good condition.   Final Clinical Impressions(s) / ED Diagnoses   Final diagnoses:  Upper abdominal pain  Right upper quadrant abdominal pain  Non-intractable vomiting with nausea, unspecified vomiting type  Fever, unspecified fever cause  Viral infection    ED Discharge Orders         Ordered    ondansetron (ZOFRAN) 4 MG tablet  Every 8 hours PRN     10/22/18 1531          Clinical Impression: 1. Right upper quadrant abdominal pain   2. Upper abdominal pain   3. Non-intractable vomiting with nausea, unspecified vomiting type   4. Fever, unspecified fever cause   5. Viral infection     Disposition: Discharge  Condition: Good  I have discussed the results, Dx and Tx plan with the pt(& family if present). He/she/they expressed understanding and agree(s) with the plan. Discharge instructions discussed at great length. Strict return precautions discussed and pt &/or family have verbalized understanding of the instructions. No further questions at  time of discharge.    New Prescriptions   ONDANSETRON (ZOFRAN) 4 MG TABLET    Take 1 tablet (4 mg total) by mouth every 8 (eight) hours as needed.    Follow Up: [facility] [address] [place] [address] [phone] Schedule an appointment as soon as possible for a visit    [facility]GENCY DEPARTMENT [address] [id number] [place] [address] [place] [address] [phone]       Dre Gamino, Canary Brim, MD 10/22/18 1537

## 2018-10-24 LAB — URINE CULTURE: Culture: NO GROWTH

## 2020-10-05 IMAGING — US US ABDOMEN LIMITED
1 series · 14 of 25 positions shown · non-contrast
Comparison: CT Abdomen and Pelvis 10/14/2017

CLINICAL DATA: 42-year-old male with upper abdominal pain and
vomiting since this morning. Diagnosed with influenza last week.

EXAM:
ULTRASOUND ABDOMEN LIMITED RIGHT UPPER QUADRANT

[Series 1: us abdomen limited · 0.12mm/px · 14 of 59 slices shown]
[im 1/59]
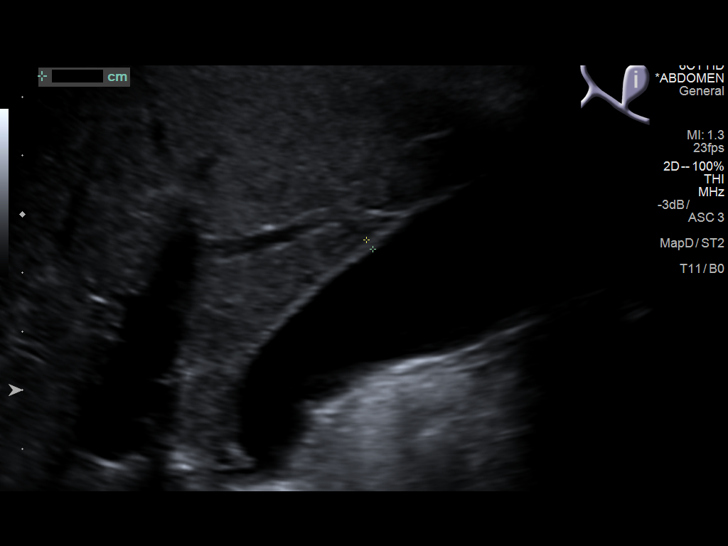
[im 5/59]
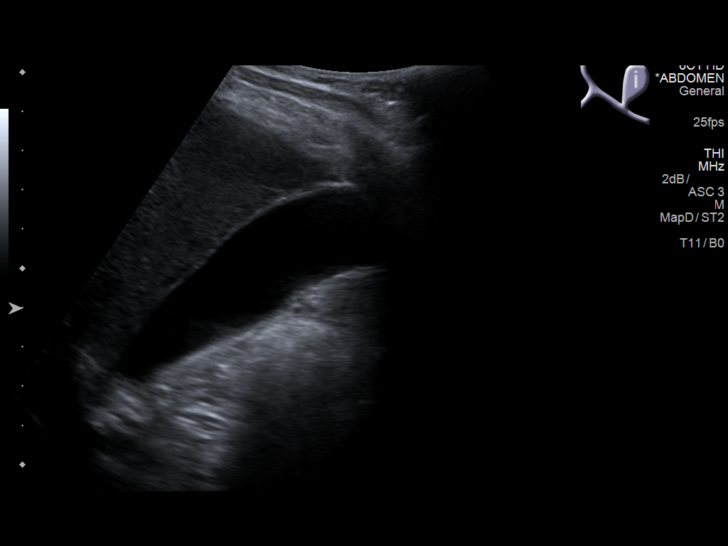
[im 10/59]
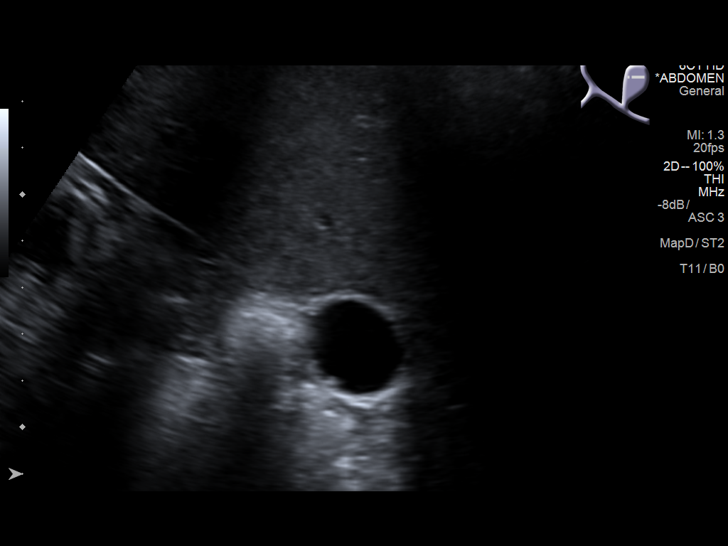
[im 15/59]
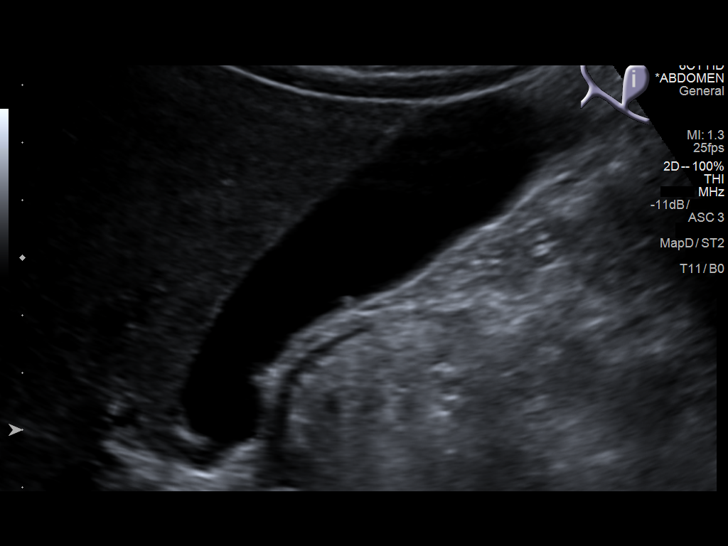
[im 20/59]
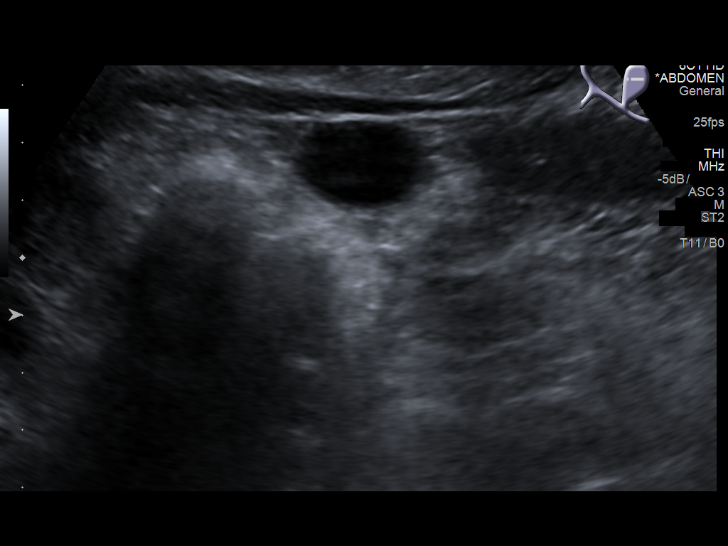
[im 22/59]
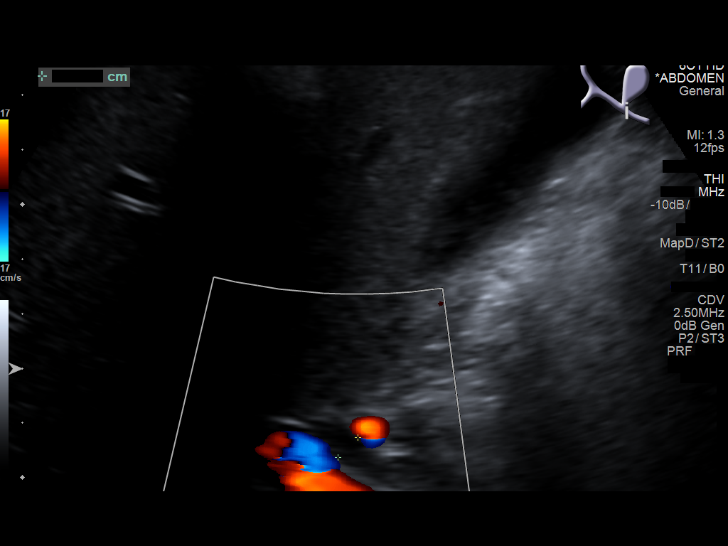
[im 27/59]
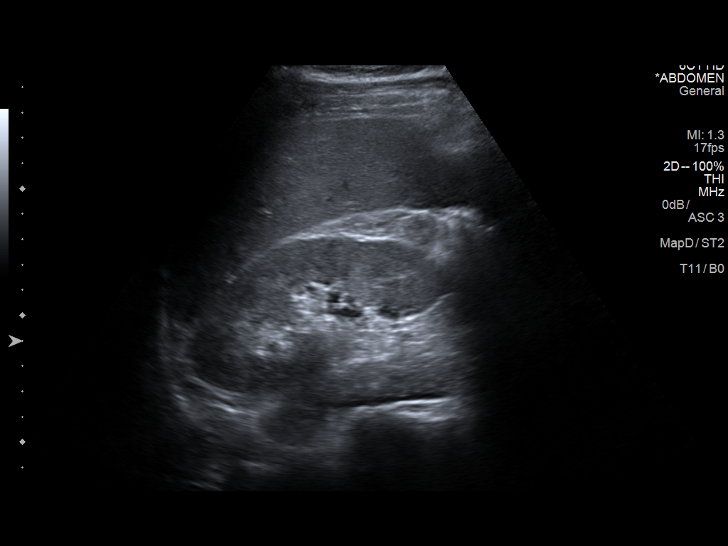
[im 32/59]
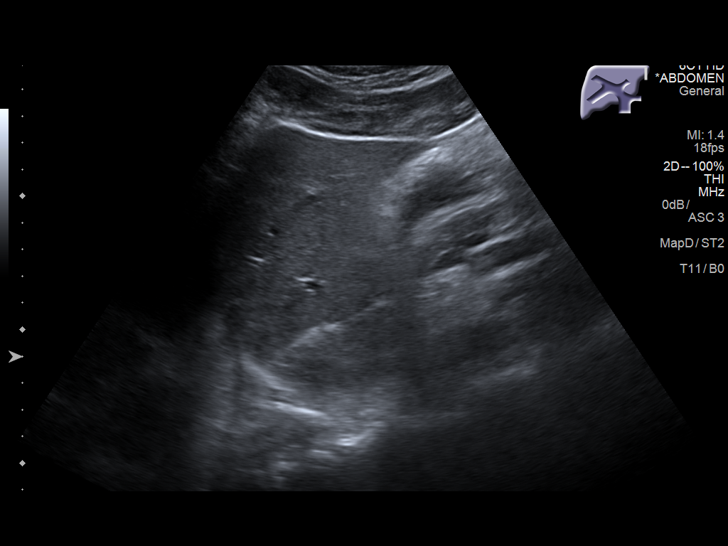
[im 37/59]
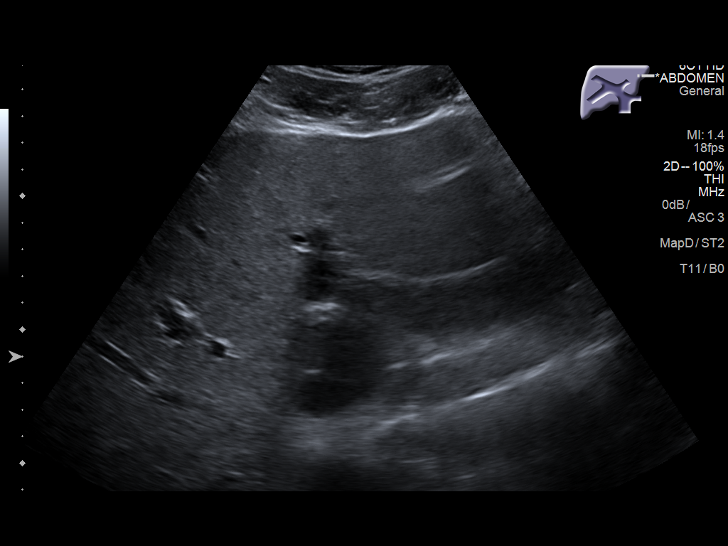
[im 39/59]
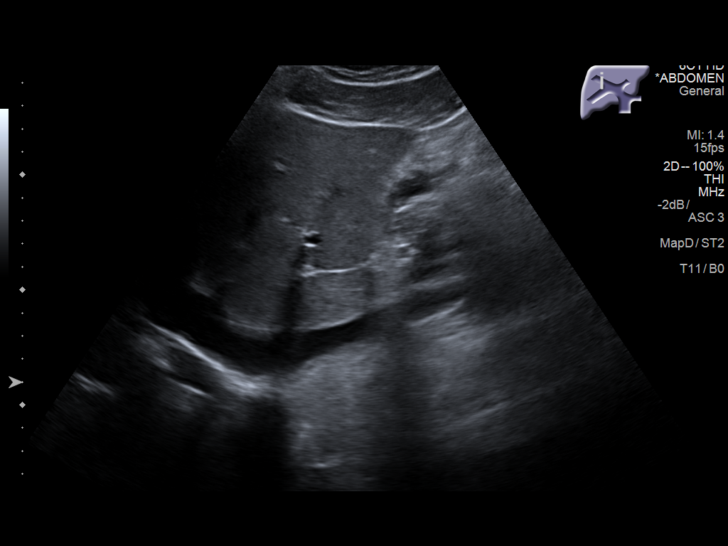
[im 44/59]
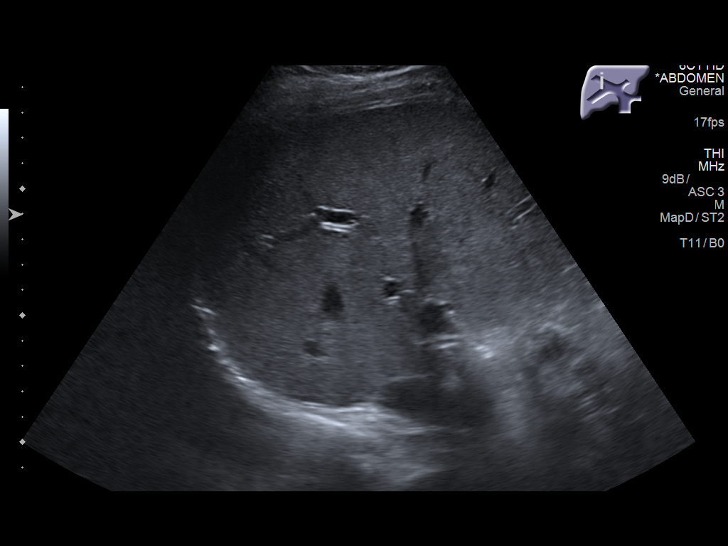
[im 49/59]
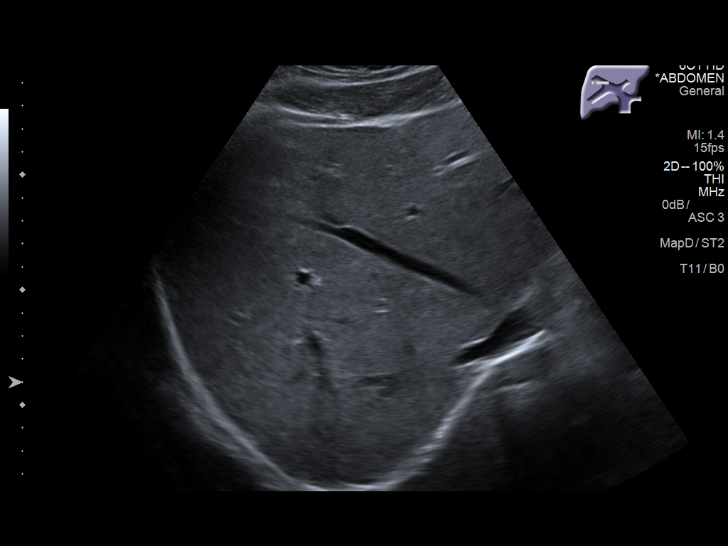
[im 54/59]
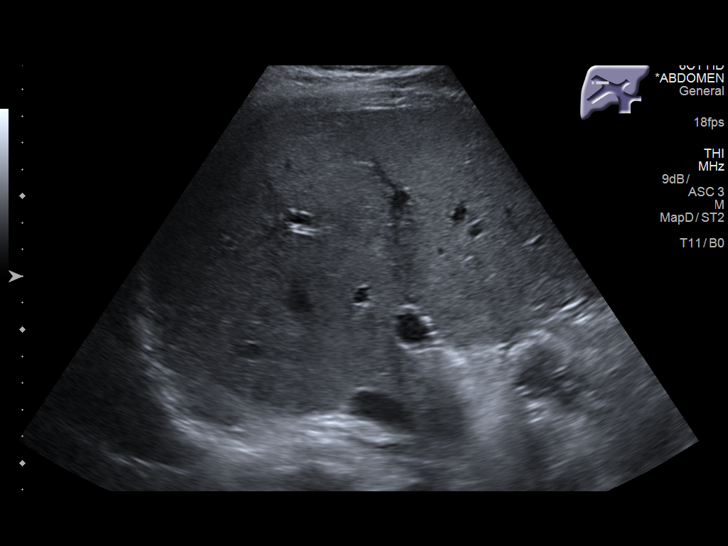
[im 59/59]
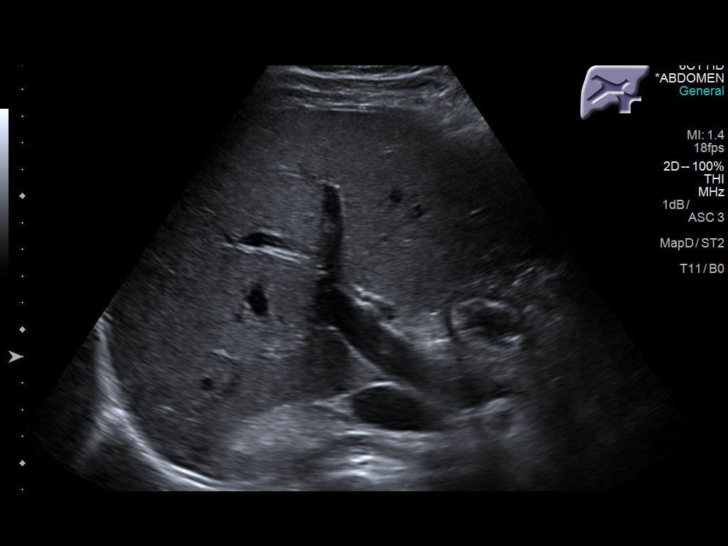

[14 of 25 positions shown; findings below may reference images not displayed]

FINDINGS: Gallbladder:

No gallstones or sludge identified. Probable tiny 2-3 millimeter
gallbladder polyp on image 11 (inconsequential). No gallbladder wall
thickening. No sonographic Murphy sign noted by sonographer.

Common bile duct:

Diameter: 4-5 millimeters, normal.

Liver:

No focal lesion identified. Within normal limits in parenchymal
echogenicity. Portal vein is patent on color Doppler imaging with
normal direction of blood flow towards the liver.

Other findings: Right kidney within normal limits; somewhat
isoechoic to the liver.
IMPRESSION: Negative.

A tiny 2-3 mm gallbladder polyp appears inconsequential.

## 2021-07-30 ENCOUNTER — Ambulatory Visit: Payer: PRIVATE HEALTH INSURANCE | Admitting: Medical
